# Patient Record
Sex: Male | Born: 1990 | Race: Black or African American | Hispanic: No | Marital: Single | Smoking: Never smoker
Health system: Southern US, Community
[De-identification: ages and names within clinical notes are randomized; demographics above are authoritative.]

## PROBLEM LIST (undated history)

## (undated) DIAGNOSIS — T7840XA Allergy, unspecified, initial encounter: Secondary | ICD-10-CM

## (undated) HISTORY — DX: Allergy, unspecified, initial encounter: T78.40XA

## (undated) HISTORY — PX: HERNIA REPAIR: SHX51

---
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
## 2010-10-24 ENCOUNTER — Encounter: Payer: Self-pay | Admitting: *Deleted

## 2013-01-27 ENCOUNTER — Ambulatory Visit (INDEPENDENT_AMBULATORY_CARE_PROVIDER_SITE_OTHER): Payer: 59 | Admitting: Emergency Medicine

## 2013-01-27 VITALS — BP 120/70 | HR 81 | Temp 98.3°F | Resp 16 | Ht 69.0 in | Wt 141.4 lb

## 2013-01-27 DIAGNOSIS — Z2089 Contact with and (suspected) exposure to other communicable diseases: Secondary | ICD-10-CM

## 2013-01-27 DIAGNOSIS — Z0000 Encounter for general adult medical examination without abnormal findings: Secondary | ICD-10-CM

## 2013-01-27 DIAGNOSIS — N489 Disorder of penis, unspecified: Secondary | ICD-10-CM

## 2013-01-27 DIAGNOSIS — N4889 Other specified disorders of penis: Secondary | ICD-10-CM

## 2013-01-27 DIAGNOSIS — Z202 Contact with and (suspected) exposure to infections with a predominantly sexual mode of transmission: Secondary | ICD-10-CM

## 2013-01-27 LAB — POCT CBC
Granulocyte percent: 55.1 %G (ref 37–80)
HCT, POC: 48.7 % (ref 43.5–53.7)
Hemoglobin: 15.9 g/dL (ref 14.1–18.1)
Lymph, poc: 2.2 (ref 0.6–3.4)
MCH, POC: 27.3 pg (ref 27–31.2)
MCV: 83.7 fL (ref 80–97)
MID (cbc): 0.5 (ref 0–0.9)
POC Granulocyte: 3.3 (ref 2–6.9)
POC LYMPH PERCENT: 36.6 %L (ref 10–50)
POC MID %: 8.3 %M (ref 0–12)
Platelet Count, POC: 208 10*3/uL (ref 142–424)
RBC: 5.82 M/uL (ref 4.69–6.13)
RDW, POC: 13.4 %
WBC: 6 10*3/uL (ref 4.6–10.2)

## 2013-01-27 LAB — RPR

## 2013-01-27 LAB — HIV ANTIBODY (ROUTINE TESTING W REFLEX): HIV: NONREACTIVE

## 2013-01-27 NOTE — Progress Notes (Signed)
  Subjective:    Patient ID: Aaron Garner, male    DOB: Sep 17, 1991, 22 y.o.   MRN: [id number]  HPI    Review of Systems  Constitutional: Negative.   HENT: Negative.   Eyes: Negative.   Respiratory: Negative.   Cardiovascular: Negative.   Gastrointestinal: Negative.   Endocrine: Negative.   Genitourinary: Positive for testicular pain.  Musculoskeletal: Negative.   Skin: Negative.   Allergic/Immunologic: Negative.   Neurological: Negative.   Hematological: Negative.   Psychiatric/Behavioral: Negative.        Objective:   Physical Exam HEENT exam is unremarkable. Neck is supple. Chest is clear to auscultation and percussion. Heart regular rate no murmurs or gallops the the abdomen is soft liver and spleen are not enlarged. Genitourinary exam reveals testicles of normal size no hernias are felt there is a small 2 mm whitish area at the base of the glans which is not ulcerated. There is also a small half centimeter cystic area in the scrotal wall on the left  Results for orders placed in visit on 01/27/13  POCT CBC      Result Value Range   WBC 6.0  4.6 - 10.2 K/uL   Lymph, poc 2.2  0.6 - 3.4   POC LYMPH PERCENT 36.6  10 - 50 %L   MID (cbc) 0.5  0 - 0.9   POC MID % 8.3  0 - 12 %M   POC Granulocyte 3.3  2 - 6.9   Granulocyte percent 55.1  37 - 80 %G   RBC 5.82  4.69 - 6.13 M/uL   Hemoglobin 15.9  14.1 - 18.1 g/dL   HCT, POC 40.9  81.1 - 53.7 %   MCV 83.7  80 - 97 fL   MCH, POC 27.3  27 - 31.2 pg   MCHC 32.6  31.8 - 35.4 g/dL   RDW, POC 91.4     Platelet Count, POC 208  142 - 424 K/uL   MPV 8.8  0 - 99.8 fL        Assessment & Plan:  Patient is concerned about STDs. We'll go ahead and do STD exposure profile.

## 2013-01-28 LAB — GC/CHLAMYDIA PROBE AMP
CT Probe RNA: NEGATIVE
GC Probe RNA: NEGATIVE

## 2013-01-29 LAB — HSV(HERPES SIMPLEX VRS) I + II AB-IGG
HSV 1 Glycoprotein G Ab, IgG: 0.18 IV
HSV 2 Glycoprotein G Ab, IgG: 0.17 IV

## 2013-07-01 ENCOUNTER — Ambulatory Visit (INDEPENDENT_AMBULATORY_CARE_PROVIDER_SITE_OTHER): Payer: 59 | Admitting: Emergency Medicine

## 2013-07-01 VITALS — BP 130/80 | HR 82 | Temp 99.2°F | Resp 20 | Ht 69.0 in | Wt 158.0 lb

## 2013-07-01 DIAGNOSIS — K4090 Unilateral inguinal hernia, without obstruction or gangrene, not specified as recurrent: Secondary | ICD-10-CM

## 2013-07-01 MED ORDER — HYDROCODONE-ACETAMINOPHEN 5-325 MG PO TABS: 1.0000 | ORAL_TABLET | ORAL | Status: DC | PRN

## 2013-07-01 NOTE — Patient Instructions (Addendum)
Hernia A hernia occurs when an internal organ pushes out through a weak spot in the abdominal wall. Hernias most commonly occur in the groin and around the navel. Hernias often can be pushed back into place (reduced). Most hernias tend to get worse over time. Some abdominal hernias can get stuck in the opening (irreducible or incarcerated hernia) and cannot be reduced. An irreducible abdominal hernia which is tightly squeezed into the opening is at risk for impaired blood supply (strangulated hernia). A strangulated hernia is a medical emergency. Because of the risk for an irreducible or strangulated hernia, surgery may be recommended to repair a hernia. CAUSES   Heavy lifting.  Prolonged coughing.  Straining to have a bowel movement.  A cut (incision) made during an abdominal surgery. HOME CARE INSTRUCTIONS   Bed rest is not required. You may continue your normal activities.  Avoid lifting more than 10 pounds (4.5 kg) or straining.  Cough gently. If you are a smoker it is best to stop. Even the best hernia repair can break down with the continual strain of coughing. Even if you do not have your hernia repaired, a cough will continue to aggravate the problem.  Do not wear anything tight over your hernia. Do not try to keep it in with an outside bandage or truss. These can damage abdominal contents if they are trapped within the hernia sac.  Eat a normal diet.  Avoid constipation. Straining over long periods of time will increase hernia size and encourage breakdown of repairs. If you cannot do this with diet alone, stool softeners may be used. SEEK IMMEDIATE MEDICAL CARE IF:   You have a fever.  You develop increasing abdominal pain.  You feel nauseous or vomit.  Your hernia is stuck outside the abdomen, looks discolored, feels hard, or is tender.  You have any changes in your bowel habits or in the hernia that are unusual for you.  You have increased pain or swelling around the  hernia.  You cannot push the hernia back in place by applying gentle pressure while lying down. MAKE SURE YOU:   Understand these instructions.  Will watch your condition.  Will get help right away if you are not doing well or get worse. Document Released: 09/20/2005 Document Revised: 12/13/2011 Document Reviewed: 05/09/2008 ExitCare Patient Information 2014 [employer].  

## 2013-07-01 NOTE — Progress Notes (Addendum)
[facility] [address], [place] [address] [phone]  Date:  07/01/2013   Name:  Aaron Garner   DOB:  May 27, 1991   MRN:  [id number]  PCP:  No PCP Per Patient    Chief Complaint: Hernia   History of Present Illness:  Aaron Garner is a 22 y.o. 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  No history of trauma or overuse.  No heavy lifting.  Has recurrent pain in right groin for the past four months. .  No improvement with over the counter medications or other home remedies. Denies other complaint or health concern today.   No dysuria, urgency or frequency  There are no active problems to display for this patient.   Past Medical History  Diagnosis Date  . Allergy     Past Surgical History  Procedure Laterality Date  . Hernia repair      History  Substance Use Topics  . Smoking status: Never Smoker   . Smokeless tobacco: Not on file  . Alcohol Use: No    Family History  Problem Relation Age of Onset  . Hypertension Mother     No Known Allergies  Medication list has been reviewed and updated.  Current Outpatient Prescriptions on File Prior to Visit  Medication Sig Dispense Refill  . loratadine (CLARITIN) 10 MG tablet Take 10 mg by mouth daily.       No current facility-administered medications on file prior to visit.    Review of Systems:  As per HPI, otherwise negative.    Physical Examination: Filed Vitals:   07/01/13 1140  BP: 130/80  Pulse: 82  Temp: 99.2 F (37.3 C)  Resp: 20   Filed Vitals:   07/01/13 1140  Height: 5\' 9"  (1.753 m)  Weight: 158 lb (71.668 kg)   Body mass index is 23.32 kg/(m^2). Ideal Body Weight: Weight in (lb) to have BMI = 25: 168.9   GEN: WDWN, NAD, Non-toxic, Alert & Oriented x 3 HEENT: Atraumatic, Normocephalic.  Ears and Nose: No external  deformity. EXTR: No clubbing/cyanosis/edema NEURO: Normal gait.  PSYCH: Normally interactive. Conversant. Not depressed or anxious appearing.  Calm demeanor.  Genitalia:  Normal male circumcised.  Testicle and cord normal Groin:  Small hernia right groin  Assessment and Plan: Right inguinal hernia recurrent   Signed,  Phillips Odor, MD

## 2013-07-01 NOTE — Addendum Note (Signed)
Addended by: Carmelina Dane on: 07/01/2013 01:09 PM   Modules accepted: Orders

## 2013-07-06 ENCOUNTER — Ambulatory Visit (INDEPENDENT_AMBULATORY_CARE_PROVIDER_SITE_OTHER): Payer: 59 | Admitting: General Surgery

## 2013-07-06 ENCOUNTER — Encounter (INDEPENDENT_AMBULATORY_CARE_PROVIDER_SITE_OTHER): Payer: Self-pay | Admitting: General Surgery

## 2013-07-06 VITALS — BP 124/76 | HR 82 | Resp 18 | Ht 69.0 in | Wt 156.0 lb

## 2013-07-06 DIAGNOSIS — R1031 Right lower quadrant pain: Secondary | ICD-10-CM

## 2013-07-06 DIAGNOSIS — N5082 Scrotal pain: Secondary | ICD-10-CM | POA: Insufficient documentation

## 2013-07-06 DIAGNOSIS — N509 Disorder of male genital organs, unspecified: Secondary | ICD-10-CM

## 2013-07-06 NOTE — Progress Notes (Signed)
Patient ID: Aaron Garner, male   DOB: [birth year], 22 y.o.   MRN: [id number]  No chief complaint on file.   HPI Aaron Garner is a 22 y.o. male.  The patient is a 22 year old male is referred by Dr. Thornton Papas for an evaluation of a recurrent right inguinal hernia. The patient has a history of a right testicular Torsion status post orchiopexy and 2009.  The patient states since that time She's had some right scrotal discomfort as well as swelling.  The patient was seen in urgent care center and was felt to have a right inguinal hernia. He has had a history of herniorrhaphy as a child. He states that Wearing a truss does provide relief of his discomfort. HPI  Past Medical History  Diagnosis Date  . Allergy     Past Surgical History  Procedure Laterality Date  . Hernia repair      Family History  Problem Relation Age of Onset  . Hypertension Mother     Social History History  Substance Use Topics  . Smoking status: Never Smoker   . Smokeless tobacco: Not on file  . Alcohol Use: No    No Known Allergies  Current Outpatient Prescriptions  Medication Sig Dispense Refill  . HYDROcodone-acetaminophen (NORCO) 5-325 MG per tablet Take 1-2 tablets by mouth every 4 (four) hours as needed for pain.  30 tablet  0  . loratadine (CLARITIN) 10 MG tablet Take 10 mg by mouth daily.       No current facility-administered medications for this visit.    Review of Systems Review of Systems  Constitutional: Negative.   HENT: Negative.   Respiratory: Negative.   Cardiovascular: Negative.   Gastrointestinal: Negative.   Neurological: Negative.   All other systems reviewed and are negative.    There were no vitals taken for this visit.  Physical Exam Physical Exam  Constitutional: He is oriented to person, place, and time. He appears well-developed and well-nourished.  HENT:  Head: Normocephalic and atraumatic.  Eyes: Conjunctivae and EOM are normal. Pupils are equal,  round, and reactive to light.  Neck: Normal range of motion. Neck supple.  Cardiovascular: Normal rate, regular rhythm and normal heart sounds.   Abdominal: Soft. Bowel sounds are normal. Hernia confirmed negative in the right inguinal area and confirmed negative in the left inguinal area.  Genitourinary: Testes normal and penis normal.  Musculoskeletal: Normal range of motion.  Neurological: He is alert and oriented to person, place, and time.    Data Reviewed None  Assessment    22 year old male with questionable recurrent right inguinal hernia versus possible scar tissue and/or hydrocele of his right testicle.     Plan    1. I would recommend at this point to proceed with a CT scan with IV contrast only.  I will call the patient back for the results. Should CT scan show a hernia we will proceed with hernia repair as this could be a potential cause of his right scrotal discomfort. 2. We will refer the patient to urology to evaluate For any possible secondary complications from his orchiopexy that because of his discomfort and or pain.       Marigene Ehlers., Mete Purdum 07/06/2013, 3:16 PM

## 2013-07-06 NOTE — Addendum Note (Signed)
Addended by: June Leap on: 07/06/2013 03:40 PM   Modules accepted: Orders

## 2013-07-13 ENCOUNTER — Other Ambulatory Visit: Payer: Self-pay

## 2013-07-13 ENCOUNTER — Ambulatory Visit
Admission: RE | Admit: 2013-07-13 | Discharge: 2013-07-13 | Disposition: A | Discharge status: Home / Self Care | Payer: 59 | Source: Ambulatory Visit | Attending: General Surgery | Admitting: General Surgery

## 2013-07-13 DIAGNOSIS — R1031 Right lower quadrant pain: Secondary | ICD-10-CM

## 2013-07-13 MED ORDER — IOHEXOL 300 MG/ML  SOLN
100.0000 mL | Freq: Once | INTRAMUSCULAR | Status: AC | PRN
  Administered 2013-07-13: 100 mL via INTRAVENOUS

## 2013-07-16 ENCOUNTER — Telehealth (INDEPENDENT_AMBULATORY_CARE_PROVIDER_SITE_OTHER): Payer: Self-pay | Admitting: General Surgery

## 2013-07-16 NOTE — Telephone Encounter (Signed)
Faxed copy of CT to Dr.Hawkes office and confirm was received per AR req

## 2013-07-16 NOTE — Telephone Encounter (Signed)
Message copied by June Leap on Mon Jul 16, 2013 11:16 AM ------      Message from: Axel Filler      Created: Mon Jul 16, 2013  9:56 AM       Can you forward this Path note to Dr. Nickola Major please?            Thanks      AR      ----- Message -----         From: Rad Results In Interface         Sent: 07/13/2013   4:16 PM           To: Axel Filler, MD                   ------

## 2013-07-21 ENCOUNTER — Encounter (INDEPENDENT_AMBULATORY_CARE_PROVIDER_SITE_OTHER): Payer: Self-pay | Admitting: General Surgery

## 2013-08-09 ENCOUNTER — Other Ambulatory Visit: Payer: Self-pay

## 2014-04-19 ENCOUNTER — Ambulatory Visit (INDEPENDENT_AMBULATORY_CARE_PROVIDER_SITE_OTHER): Payer: 59 | Admitting: Family Medicine

## 2014-04-19 VITALS — BP 112/60 | HR 90 | Temp 98.6°F | Resp 18

## 2014-04-19 DIAGNOSIS — Z23 Encounter for immunization: Secondary | ICD-10-CM

## 2014-04-19 NOTE — Progress Notes (Signed)
   Subjective:    Patient ID: Aaron Garner, male    DOB: 1991-03-28, 23 y.o.   MRN: [id number]  HPI Patient presents today for Tdap vaccination. 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 He can not recall his last tetanus vaccine.   Review of Systems     Objective:   Physical Exam        Assessment & Plan:  1. Need for prophylactic vaccination with combined diphtheria-tetanus-pertussis (DTP) vaccine - Tdap vaccine greater than or equal to 7yo IM   Emi Belfasteborah B. Gessner, FNP-BC  [facility]  04/19/2014 3:23 PM

## 2015-01-14 ENCOUNTER — Ambulatory Visit (INDEPENDENT_AMBULATORY_CARE_PROVIDER_SITE_OTHER): Payer: 59 | Admitting: Emergency Medicine

## 2015-01-14 VITALS — BP 114/72 | HR 67 | Temp 98.3°F | Resp 16 | Ht 69.75 in | Wt 137.0 lb

## 2015-01-14 DIAGNOSIS — Z202 Contact with and (suspected) exposure to infections with a predominantly sexual mode of transmission: Secondary | ICD-10-CM | POA: Diagnosis not present

## 2015-01-14 LAB — HIV ANTIBODY (ROUTINE TESTING W REFLEX): HIV 1&2 Ab, 4th Generation: NONREACTIVE

## 2015-01-14 NOTE — Patient Instructions (Signed)

## 2015-01-14 NOTE — Progress Notes (Signed)
[facility] [address], [place] [address] [phone]  Date:  01/14/2015   Name:  Aaron MattesMichael D Freel   DOB:  1991/01/18   MRN:  [id number]  PCP:  Carmelina DaneAnderson, Mckinleigh Schuchart S, MD    Chief Complaint: STD Testing   History of Present Illness:  Aaron MattesMichael D Garner is a 24 y.o. very pleasant male patient who presents with the following:  STD test.  No history of prior infection Not symptomatic, just concerned Denies other complaint or health concern today.   There are no active problems to display for this patient.   Past Medical History  Diagnosis Date  . Allergy     Past Surgical History  Procedure Laterality Date  . Hernia repair      History  Substance Use Topics  . Smoking status: Never Smoker   . Smokeless tobacco: Not on file  . Alcohol Use: No    Family History  Problem Relation Age of Onset  . Hypertension Mother     No Known Allergies  Medication list has been reviewed and updated.  Current Outpatient Prescriptions on File Prior to Visit  Medication Sig Dispense Refill  . loratadine (CLARITIN) 10 MG tablet Take 10 mg by mouth daily.     No current facility-administered medications on file prior to visit.    Review of Systems:  As per HPI, otherwise negative.    Physical Examination: Filed Vitals:   01/14/15 0859  BP: 114/72  Pulse: 67  Temp: 98.3 F (36.8 C)  Resp: 16   Filed Vitals:   01/14/15 0859  Height: 5' 9.75" (1.772 m)  Weight: 137 lb (62.143 kg)   Body mass index is 19.79 kg/(m^2). Ideal Body Weight: Weight in (lb) to have BMI = 25: 172.6  GEN: WDWN, NAD, Non-toxic, A & O x 3 HEENT: Atraumatic, Normocephalic. Neck supple. No masses, No LAD. Ears and Nose: No external deformity. CV: RRR, No M/G/R. No JVD. No thrill. No extra heart sounds. PULM: CTA B, no wheezes, crackles, rhonchi. No retractions. No resp. distress. No accessory muscle use. ABD: S, NT, ND, +BS. No rebound. No HSM. EXTR: No c/c/e NEURO  Normal gait.  PSYCH: Normally interactive. Conversant. Not depressed or anxious appearing.  Calm demeanor.    Assessment and Plan: STD testing. Labs pending Signed,  Phillips OdorJeffery Dearius Hoffmann, MD

## 2015-01-15 LAB — RPR

## 2015-01-15 LAB — GC/CHLAMYDIA PROBE AMP
CT PROBE, AMP APTIMA: NEGATIVE
GC Probe RNA: NEGATIVE

## 2015-04-17 ENCOUNTER — Encounter: Payer: Self-pay | Admitting: Dietician

## 2015-04-17 ENCOUNTER — Encounter: Payer: BC Managed Care – PPO | Attending: Emergency Medicine | Admitting: Dietician

## 2015-04-17 VITALS — Ht 69.0 in | Wt 131.4 lb

## 2015-04-17 DIAGNOSIS — E739 Lactose intolerance, unspecified: Secondary | ICD-10-CM | POA: Diagnosis not present

## 2015-04-17 DIAGNOSIS — Z681 Body mass index (BMI) 19 or less, adult: Secondary | ICD-10-CM | POA: Insufficient documentation

## 2015-04-17 DIAGNOSIS — Z713 Dietary counseling and surveillance: Secondary | ICD-10-CM | POA: Insufficient documentation

## 2015-04-17 NOTE — Progress Notes (Signed)
  Medical Nutrition Therapy:  Appt start time: 0430 end time:  525   Assessment:  Primary concerns today: Aaron Garner states that he is here to discuss a better diet and he wants more "control" of his GI symptoms. He has been diagnosed with lactose intolerance. He thinks he may also be gluten intolerant. He can tolerate yogurt, but not cheese or milk. He has taken Lactaid pills but says they don't help much. Aaron Garner would like to eat a healthier diet overall. 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 He lives with his parents and his mom cooks. He plans to move out and do most of his own cooking. Considering becoming vegan or vegetarian. He experiences dumping symptoms with candy. With wheat he gets "rumbles in his stomach" and gas pain.   Preferred Learning Style:   No preference indicated   Learning Readiness:   Ready   MEDICATIONS: none   DIETARY INTAKE:  Foods not tolerated: -Milk and cheese -Juice -Wheat products (bread) -Sweets -Eggs -Nuts    24-hr recall:  B ( AM): usually skips, granola bar  Snk ( AM): fruit (orange or apple)  L ( PM): pork chops, hamburgers, chicken (fast food) Snk ( PM):  D ( PM): meat, vegetables Snk ( PM): fruit or candy  Beverages: water, juice, green tea, sweet tea  Usual physical activity: basketball and workout plan  Estimated energy needs: 2500-2700 calories  Progress Towards Goal(s):  In progress.   Nutritional Diagnosis:  [place]-1.4 Altered GI function As related to lactose intolerance and possible gluten intolerance.  As evidenced by patient reports abdominal cramping and pain and changes in bowel habits with many foods.    Intervention:  Nutrition counseling provided. Goals: -Keep a food diary to try to identify foods that you do not tolerate  -Include symptoms in your diary -Add a multivitamin   Work on eating a variety of foods and include a protein food with each meal: Fruit Vegetables Lactaid or almond  milk Rice Potatoes  Beans Seeds (sunflower, pumpkin)  Quinoa and other grains Gluten free, corn based tortillas Edamame  Try Amy's Gluten free burritos and other gluten free meals Try having a protein shake for breakfast: protein powder, almond milk, fruit, spinach  Sample provided and patient instructed on proper use: Unjury protein powder (vanilla - qty 4) Lot#: [id number] Exp: 04/2016  Teaching Method Utilized:  Visual Auditory  Handouts given during visit include:  Vegetarian proteins  Gluten free eating  Meal planning card  MyPlate  Barriers to learning/adherence to lifestyle change: GI symptoms  Demonstrated degree of understanding via:  Teach Back   Monitoring/Evaluation:  Dietary intake and GI symptoms prn.

## 2015-04-17 NOTE — Patient Instructions (Addendum)
-  Keep a food diary to try to identify foods that you do not tolerate  -Include symptoms in your diary  -Add a multivitamin   Work on eating a variety of foods and include a protein food with each meal: Fruit Vegetables Lactaid or almond milk Rice Potatoes  Beans Seeds (sunflower, pumpkin)  Quinoa and other grains Gluten free, corn based tortillas Edamame  Try Amy's Gluten free burritos and other gluten free meals  Try having a protein shake for breakfast: protein powder, almond milk, fruit, spinach

## 2016-07-13 ENCOUNTER — Ambulatory Visit (INDEPENDENT_AMBULATORY_CARE_PROVIDER_SITE_OTHER): Payer: BC Managed Care – PPO | Admitting: Family Medicine

## 2016-07-13 VITALS — BP 130/76 | HR 82 | Temp 97.7°F | Resp 16 | Ht 69.0 in | Wt 131.0 lb

## 2016-07-13 DIAGNOSIS — Z113 Encounter for screening for infections with a predominantly sexual mode of transmission: Secondary | ICD-10-CM

## 2016-07-13 DIAGNOSIS — N489 Disorder of penis, unspecified: Secondary | ICD-10-CM | POA: Diagnosis not present

## 2016-07-13 DIAGNOSIS — L708 Other acne: Secondary | ICD-10-CM

## 2016-07-13 DIAGNOSIS — Z114 Encounter for screening for human immunodeficiency virus [HIV]: Secondary | ICD-10-CM

## 2016-07-13 MED ORDER — DOXYCYCLINE HYCLATE 100 MG PO TABS: 100.0000 mg | ORAL_TABLET | Freq: Two times a day (BID) | ORAL | 0 refills | Status: DC

## 2016-07-13 NOTE — Assessment & Plan Note (Signed)
Will treat as acne, as they appeared to be white comedones.  Will refer to dermatology in case this does not improve and with potential for biopsy.  Recommended switching condoms as well.

## 2016-07-13 NOTE — Progress Notes (Signed)
Patient ID: Aaron Garner, male   DOB: [birth year], 25 y.o.   MRN: [id number]  Pt independently assessed by myself , reviewed documentation and agree w/ assessment and plan.  Aaron SorensonEva Shaw, M.D. [facility]  [facility] [address] [place], [place] [address] [phone] phone [phone] fax  07/13/16 9:51 PM

## 2016-07-13 NOTE — Patient Instructions (Signed)
Placed referral to Dermatology.  Take antibiotic as prescribed and cancel referral if resolves.  Try switching condoms.

## 2016-07-13 NOTE — Progress Notes (Signed)
   Subjective:    Patient ID: Aaron Garner, male    DOB: [birth year], 25 y.o.   MRN: [id number]  HPI Presents with lesions on penile shaft that began 1 month ago.  He is sexually active with 1 partner, but has been sexually active with 2 partners in the past 6 months.  He has been screened previously and it has been negative.  Denies dysuria, fevers, chills, penile discharge.  Lesions are not pruritic or painful.  Has tried switching lubricants, has used the same type of condoms.  He has tried an acne medication on it.      Review of Systems  Constitutional: Negative for chills, fatigue and fever.  Genitourinary: Negative for discharge, dysuria, penile pain, penile swelling, scrotal swelling, testicular pain and urgency.  Musculoskeletal: Negative for joint swelling and myalgias.  Skin: Positive for rash. Negative for color change, pallor and wound.  Neurological: Negative for dizziness and weakness.  All other systems reviewed and are negative.      Objective:   Physical Exam  Constitutional: He is oriented to person, place, and time. He appears well-developed and well-nourished. No distress.  HENT:  Head: Normocephalic and atraumatic.  Eyes: Conjunctivae are normal. No scleral icterus.  Neck: Normal range of motion. Neck supple.  Pulmonary/Chest: Effort normal and breath sounds normal.  Genitourinary: No penile tenderness.  Genitourinary Comments: Small white macules present throughout penile shaft, none on glands of scrotum.  No central umbilication.  Musculoskeletal: He exhibits no edema or deformity.  Neurological: He is alert and oriented to person, place, and time.  Skin: Skin is warm. No rash noted. He is not diaphoretic. No erythema. No pallor.  Psychiatric: He has a normal mood and affect. His behavior is normal. Judgment and thought content normal.  Nursing note and vitals reviewed.   BP 130/76 (BP Location: Right Arm, Patient Position: Sitting, Cuff Size: Normal)    Pulse 82   Temp 97.7 F (36.5 C) (Oral)   Resp 16   Ht 5\' 9"  (1.753 m)   Wt 131 lb (59.4 kg)   SpO2 100%   BMI 19.35 kg/m        Assessment & Plan:  Penile lesion Will treat as acne, as they appeared to be white comedones.  Will refer to dermatology in case this does not improve and with potential for biopsy.  Recommended switching condoms as well.

## 2016-07-14 LAB — GC/CHLAMYDIA PROBE AMP
CT PROBE, AMP APTIMA: NOT DETECTED
GC PROBE AMP APTIMA: NOT DETECTED

## 2016-07-14 LAB — HIV ANTIBODY (ROUTINE TESTING W REFLEX): HIV 1&2 Ab, 4th Generation: NONREACTIVE

## 2016-07-15 LAB — RPR

## 2016-07-20 ENCOUNTER — Encounter: Payer: Self-pay | Admitting: Student

## 2016-07-20 NOTE — Progress Notes (Signed)
Resulted Orders  GC/Chlamydia Probe Amp  Result Value Ref Range   CT Probe RNA NOT DETECTED      Comment:                        **Normal Reference Range: NOT DETECTED**   This test was performed using the APTIMA COMBO2 Assay ([employer]   The analytical performance characteristics of this assay, when used to test SurePath specimens have been determined by [facility]      GC Probe RNA NOT DETECTED      Comment:                        **Normal Reference Range: NOT DETECTED**   This test was performed using the APTIMA COMBO2 Assay ([employer]   The analytical performance characteristics of this assay, when used to test SurePath specimens have been determined by [facility]      Narrative   Performed at:  [facility]                [address], [address]                [place], [place] [address]  HIV antibody  Result Value Ref Range   HIV 1&2 Ab, 4th Generation NONREACTIVE NONREACTIVE     Comment:       HIV-1 antigen and HIV-1/HIV-2 antibodies were not detected.  There is no laboratory evidence of HIV infection.   HIV-1/2 Antibody Diff        Not indicated. HIV-1 RNA, Qual TMA          Not indicated.     PLEASE NOTE: This information has been disclosed to you from records whose confidentiality may be protected by state law. If your state requires such protection, then the state law prohibits you from making any further disclosure of the information without the specific written consent of the person to whom it pertains, or as otherwise permitted by law. A general authorization for the release of medical or other information is NOT sufficient for this purpose.   The performance of this assay has not been clinically validated in patients less than 93 years old.   For additional information please refer to http://education.[facility].com/faq/FAQ106.  (This link is being provided for informational/educational purposes only.)      Narrative   Performed at:  [facility]                [address], [address]                [place], [place] [address]  RPR  Result Value Ref Range   RPR Ser Ql NON REAC NON REAC   Narrative   Performed at:  [facility]                [address], [address]                [place], [place] [address]

## 2017-12-21 ENCOUNTER — Ambulatory Visit: Payer: BC Managed Care – PPO | Admitting: Physician Assistant

## 2017-12-21 ENCOUNTER — Encounter: Payer: Self-pay | Admitting: Physician Assistant

## 2017-12-21 ENCOUNTER — Other Ambulatory Visit: Payer: Self-pay

## 2017-12-21 VITALS — BP 137/74 | HR 81 | Temp 98.6°F | Resp 16 | Ht 69.0 in | Wt 136.2 lb

## 2017-12-21 DIAGNOSIS — Z202 Contact with and (suspected) exposure to infections with a predominantly sexual mode of transmission: Secondary | ICD-10-CM

## 2017-12-21 DIAGNOSIS — M62838 Other muscle spasm: Secondary | ICD-10-CM

## 2017-12-21 DIAGNOSIS — H00015 Hordeolum externum left lower eyelid: Secondary | ICD-10-CM | POA: Diagnosis not present

## 2017-12-21 DIAGNOSIS — R319 Hematuria, unspecified: Secondary | ICD-10-CM | POA: Diagnosis not present

## 2017-12-21 LAB — POCT URINALYSIS DIP (MANUAL ENTRY)
Bilirubin, UA: NEGATIVE
Glucose, UA: NEGATIVE mg/dL
Ketones, POC UA: NEGATIVE mg/dL
Leukocytes, UA: NEGATIVE
Nitrite, UA: NEGATIVE
Protein Ur, POC: NEGATIVE mg/dL
Spec Grav, UA: 1.02 (ref 1.010–1.025)
Urobilinogen, UA: 1 E.U./dL
pH, UA: 6.5 (ref 5.0–8.0)

## 2017-12-21 LAB — POC MICROSCOPIC URINALYSIS (UMFC): Mucus: ABSENT

## 2017-12-21 MED ORDER — CYCLOBENZAPRINE HCL 10 MG PO TABS: 10.0000 mg | ORAL_TABLET | Freq: Three times a day (TID) | ORAL | 0 refills | Status: DC | PRN

## 2017-12-21 MED ORDER — MUPIROCIN 2 % EX OINT: 1.0000 "application " | TOPICAL_OINTMENT | Freq: Three times a day (TID) | CUTANEOUS | 1 refills | Status: DC

## 2017-12-21 NOTE — Progress Notes (Signed)
   Aaron MattesMichael D Loyal  MRN: [id number] DOB: 07-20-1991  PCP: Ethelda ChickSmith, Kristi M, MD  Subjective:  Pt is a 27 year old male who presents to clinic for several complaints.   Possible exposure to STDs. "It's just something I do every year".   C/o neck and back pain x 1 year. Pain started in right shoulder. Radiates down right side of his back. "feels tight".   Stye of left lower eye lid.   Review of Systems  Musculoskeletal: Positive for back pain and neck pain.  Skin: Positive for wound.    Patient Active Problem List   Diagnosis Date Noted  . Other acne 07/13/2016  . Penile lesion 07/13/2016    Current Outpatient Medications on File Prior to Visit  Medication Sig Dispense Refill  . loratadine (CLARITIN) 10 MG tablet Take 10 mg by mouth daily.     No current facility-administered medications on file prior to visit.     No Known Allergies   Objective:  BP 137/74   Pulse 81   Temp 98.6 F (37 C) (Oral)   Resp 16   Ht 5\' 9"  (1.753 m)   Wt 136 lb 3.2 oz (61.8 kg)   SpO2 100%   BMI 20.11 kg/m   Physical Exam  Constitutional: He is oriented to person, place, and time and well-developed, well-nourished, and in no distress. No distress.  Cardiovascular: Normal rate, regular rhythm and normal heart sounds.  Musculoskeletal:       Cervical back: He exhibits tenderness and spasm. He exhibits normal range of motion and no bony tenderness.       Back:  Neurological: He is alert and oriented to person, place, and time. GCS score is 15.  Skin: Skin is warm and dry.  Psychiatric: Mood, memory, affect and judgment normal.  Vitals reviewed.   A trigger point injection was performed at the site of maximal tenderness using 1% plain Lidocaine. This was well tolerated, and followed by moderate relief of pain.  Assessment and Plan :  1. Possible exposure to STD - Chlamydia/Gonococcus/Trichomonas, NAA - HIV antibody - RPR - Pt denies symptoms. Requests routine screening. Labs are  pending. Will contact with results.  2. Hematuria, unspecified type - POCT urinalysis dipstick - POCT Microscopic Urinalysis ([facility]) - Urine Culture 3. Hordeolum externum of left lower eyelid - mupirocin ointment (BACTROBAN) 2 %; Apply 1 application topically 3 (three) times daily.  Dispense: 22 g; Refill: 1 - Con't warm compress. Apply bactroban to external area only. OK to refer to ophthalmology for I&D if no improvement.  4. Muscle spasm - cyclobenzaprine (FLEXERIL) 10 MG tablet; Take 1 tablet (10 mg total) by mouth 3 (three) times daily as needed for muscle spasms.  Dispense: 12 tablet; Refill: 0 - Pt reports improvement after trigger point injection. Advised heat, massage, hydration.  RTC in 2-3 weeks if pain returns.   Marco CollieWhitney Trenton Verne, PA-C  Primary Care at [facility] 12/21/2017 10:22 AM

## 2017-12-21 NOTE — Patient Instructions (Addendum)
You can take flexeril to help your shoulder.  Massage your shoulder several times a day with tennis ball.  Apply moist heat 2-3 times daily.  Stretch your neck muscles.   Apply heat to your eye - warm compresses, which are placed on the face for about 15 minutes four times per day. Massage and gentle wiping of the affected eyelid after the warm compress can also aid in drainage. Apply the mupirocin ointment to your eye 2-3 times daily  - do not put this in your eye!  If, despite management with warm compresses, the lesion does not reduce in size within one to two weeks, the patient should be referred to an ophthalmologist for incision and drainage.   Thank you for coming in today. I hope you feel we met your needs.  Feel free to call PCP if you have any questions or further requests.  Please consider signing up for MyChart if you do not already have it, as this is a great way to communicate with me.  Best,  Whitney McVey, PA-C  IF you received an x-ray today, you will receive an invoice from [facility]. Please contact [facility] at [phone] with questions or concerns regarding your invoice.   IF you received labwork today, you will receive an invoice from [facility]. Please contact [facility] at [phone] with questions or concerns regarding your invoice.   Our billing staff will not be able to assist you with questions regarding bills from these companies.  You will be contacted with the lab results as soon as they are available. The fastest way to get your results is to activate your My Chart account. Instructions are located on the last page of this paperwork. If you have not heard from [place] regarding the results in 2 weeks, please contact this office.

## 2017-12-22 LAB — URINE CULTURE: Organism ID, Bacteria: NO GROWTH

## 2017-12-22 LAB — RPR: RPR Ser Ql: NONREACTIVE

## 2017-12-22 LAB — HIV ANTIBODY (ROUTINE TESTING W REFLEX): HIV Screen 4th Generation wRfx: NONREACTIVE

## 2017-12-27 ENCOUNTER — Telehealth: Payer: Self-pay | Admitting: Family Medicine

## 2017-12-27 NOTE — Telephone Encounter (Signed)
Copied from CRM [id number]. Topic: Quick Communication - See Telephone Encounter >> Dec 27, 2017  9:37 AM Arlyss Gandyichardson, Lucetta Baehr N, NT wrote: CRM for notification. See Telephone encounter for: 12/27/17. Pt would like a call with his lab results.

## 2017-12-27 NOTE — Telephone Encounter (Signed)
Provider, please review and release results.  

## 2017-12-28 LAB — CHLAMYDIA/GONOCOCCUS/TRICHOMONAS, NAA
Chlamydia by NAA: NEGATIVE
Gonococcus by NAA: NEGATIVE
Trich vag by NAA: NEGATIVE

## 2018-01-06 NOTE — Telephone Encounter (Signed)
Please call pt and let him know he is negative for gonorrhea, chlamydia, trichomonas, HIV and syphilis. Thank you!

## 2018-01-09 NOTE — Telephone Encounter (Signed)
Pt advised.

## 2018-02-17 ENCOUNTER — Encounter: Payer: Self-pay | Admitting: Family Medicine

## 2018-02-22 ENCOUNTER — Encounter: Payer: Self-pay | Admitting: Family Medicine

## 2020-04-09 ENCOUNTER — Encounter: Payer: Self-pay | Admitting: Registered Nurse

## 2020-04-09 ENCOUNTER — Other Ambulatory Visit: Payer: Self-pay

## 2020-04-09 ENCOUNTER — Ambulatory Visit: Payer: BC Managed Care – PPO | Admitting: Registered Nurse

## 2020-04-09 ENCOUNTER — Other Ambulatory Visit (HOSPITAL_COMMUNITY)
Admission: RE | Admit: 2020-04-09 | Discharge: 2020-04-09 | Disposition: A | Discharge status: Home / Self Care | Payer: 59 | Source: Ambulatory Visit | Attending: Registered Nurse | Admitting: Registered Nurse

## 2020-04-09 VITALS — BP 150/90 | HR 94 | Temp 97.8°F | Ht 69.0 in | Wt 157.6 lb

## 2020-04-09 DIAGNOSIS — R4582 Worries: Secondary | ICD-10-CM | POA: Diagnosis present

## 2020-04-09 DIAGNOSIS — Z113 Encounter for screening for infections with a predominantly sexual mode of transmission: Secondary | ICD-10-CM

## 2020-04-09 DIAGNOSIS — Z1159 Encounter for screening for other viral diseases: Secondary | ICD-10-CM | POA: Diagnosis not present

## 2020-04-09 NOTE — Patient Instructions (Signed)
° ° ° °  If you have lab work done today you will be contacted with your lab results within the next 2 weeks.  If you have not heard from us then please contact us. The fastest way to get your results is to register for My Chart. ° ° °IF you received an x-ray today, you will receive an invoice from [facility]. Please contact [facility] at [phone] with questions or concerns regarding your invoice.  ° °IF you received labwork today, you will receive an invoice from [facility]. Please contact [facility] at [phone] with questions or concerns regarding your invoice.  ° °Our billing staff will not be able to assist you with questions regarding bills from these companies. ° °You will be contacted with the lab results as soon as they are available. The fastest way to get your results is to activate your My Chart account. Instructions are located on the last page of this paperwork. If you have not heard from us regarding the results in 2 weeks, please contact this office. °  ° ° ° °

## 2020-04-09 NOTE — Progress Notes (Signed)
Established Patient Office Visit  Subjective:  Patient ID: Aaron Garner, male    DOB: 1991-01-17  Age: 29 y.o. MRN: [id number]  CC:  Chief Complaint  Patient presents with  . std testing    Pt is here just for routine testing    HPI Aaron Garner presents for STD screen  In a monogamous relationship x 1 year. No symptoms. Partner does not have symptoms. No previous positive testing.  Interested in full testing for routine peace of mind  Past Medical History:  Diagnosis Date  . Allergy     Past Surgical History:  Procedure Laterality Date  . HERNIA REPAIR      Family History  Problem Relation Age of Onset  . Hypertension Mother     Social History   Socioeconomic History  . Marital status: Single    Spouse name: Not on file  . Number of children: Not on file  . Years of education: Not on file  . Highest education level: Not on file  Occupational History  . Occupation: Electronics engineer  Tobacco Use  . Smoking status: Never Smoker  . Smokeless tobacco: Never Used  Substance and Sexual Activity  . Alcohol use: No  . Drug use: No  . Sexual activity: Yes  Other Topics Concern  . Not on file  Social History Narrative   Single. Education: Electronics engineer. Exercise: Calisthenics 3 days a week for 2 hours.         Social Determinants of Health   Financial Resource Strain:   . Difficulty of Paying Living Expenses:   Food Insecurity:   . Worried About Charity fundraiser in the Last Year:   . Arboriculturist in the Last Year:   Transportation Needs:   . Film/video editor (Medical):   Marland Kitchen Lack of Transportation (Non-Medical):   Physical Activity:   . Days of Exercise per Week:   . Minutes of Exercise per Session:   Stress:   . Feeling of Stress :   Social Connections:   . Frequency of Communication with Friends and Family:   . Frequency of Social Gatherings with Friends and Family:   . Attends Religious Services:   . Active Member of Clubs or  Organizations:   . Attends Archivist Meetings:   Marland Kitchen Marital Status:   Intimate Partner Violence:   . Fear of Current or Ex-Partner:   . Emotionally Abused:   Marland Kitchen Physically Abused:   . Sexually Abused:     Outpatient Medications Prior to Visit  Medication Sig Dispense Refill  . loratadine (CLARITIN) 10 MG tablet Take 10 mg by mouth daily.    . cyclobenzaprine (FLEXERIL) 10 MG tablet Take 1 tablet (10 mg total) by mouth 3 (three) times daily as needed for muscle spasms. 12 tablet 0  . mupirocin ointment (BACTROBAN) 2 % Apply 1 application topically 3 (three) times daily. 22 g 1   No facility-administered medications prior to visit.    No Known Allergies  ROS Review of Systems Negative on 10pt ROS    Objective:    Physical Exam Vitals and nursing note reviewed.  Constitutional:      General: He is not in acute distress.    Appearance: Normal appearance. He is normal weight. He is not ill-appearing, toxic-appearing or diaphoretic.  Cardiovascular:     Rate and Rhythm: Normal rate and regular rhythm.  Neurological:     General: No focal deficit present.  Mental Status: He is alert and oriented to person, place, and time. Mental status is at baseline.  Psychiatric:        Mood and Affect: Mood normal.        Behavior: Behavior normal.        Thought Content: Thought content normal.        Judgment: Judgment normal.     BP (!) 150/90   Pulse 94   Temp 97.8 F (36.6 C) (Temporal)   Ht _0  (1.753 m)   Wt 157 lb 9.6 oz (71.5 kg)   SpO2 99%   BMI 23.27 kg/m  Wt Readings from Last 3 Encounters:  04/09/20 157 lb 9.6 oz (71.5 kg)  12/21/17 136 lb 3.2 oz (61.8 kg)  07/13/16 131 lb (59.4 kg)     Health Maintenance Due  Topic Date Due  . Hepatitis C Screening  Never done  . COVID-19 Vaccine (1) Never done    There are no preventive care reminders to display for this patient.  No results found for: TSH Lab Results  Component Value Date   WBC 6.0  01/27/2013   HGB 15.9 01/27/2013   HCT 48.7 01/27/2013   MCV 83.7 01/27/2013   No results found for: NA, K, CHLORIDE, CO2, GLUCOSE, BUN, CREATININE, BILITOT, ALKPHOS, AST, ALT, PROT, ALBUMIN, CALCIUM, ANIONGAP, EGFR, GFR No results found for: CHOL No results found for: HDL No results found for: LDLCALC No results found for: TRIG No results found for: CHOLHDL No results found for: HGBA1C    Assessment & Plan:   Problem List Items Addressed This Visit    None    Visit Diagnoses    Screening for viral disease    -  Primary   Relevant Orders   HIV antibody (with reflex)   Hepatitis C antibody   Screen for STD (sexually transmitted disease)       Relevant Orders   GC/Chlamydia probe amp ([place])not at [facility]   RPR   HIV antibody (with reflex)   Hepatitis C antibody   Worries       Relevant Orders   GC/Chlamydia probe amp ([place])not at [facility]   RPR   HIV antibody (with reflex)   Hepatitis C antibody      No orders of the defined types were placed in this encounter.   Follow-up: No follow-ups on file.   PLAN  Testing collected  Will follow up as warranted  Patient encouraged to call clinic with any questions, comments, or concerns.  Maximiano Coss, NP

## 2020-04-10 LAB — HEPATITIS C ANTIBODY: Hep C Virus Ab: <0.1 s/co ratio (ref 0.0–0.9)

## 2020-04-10 LAB — HIV ANTIBODY (ROUTINE TESTING W REFLEX): HIV Screen 4th Generation wRfx: NONREACTIVE

## 2020-04-10 LAB — RPR: RPR Ser Ql: NONREACTIVE

## 2020-04-11 LAB — GC/CHLAMYDIA PROBE AMP (~~LOC~~) NOT AT ARMC
Chlamydia: NEGATIVE
Comment: NEGATIVE
Comment: NORMAL
Neisseria Gonorrhea: NEGATIVE

## 2020-11-25 ENCOUNTER — Encounter: Payer: Self-pay | Admitting: Registered Nurse

## 2020-11-25 ENCOUNTER — Other Ambulatory Visit: Payer: Self-pay

## 2020-11-25 ENCOUNTER — Other Ambulatory Visit (HOSPITAL_COMMUNITY)
Admission: RE | Admit: 2020-11-25 | Discharge: 2020-11-25 | Disposition: A | Discharge status: Home / Self Care | Payer: 59 | Source: Ambulatory Visit | Attending: Registered Nurse | Admitting: Registered Nurse

## 2020-11-25 ENCOUNTER — Ambulatory Visit (INDEPENDENT_AMBULATORY_CARE_PROVIDER_SITE_OTHER): Payer: 59 | Admitting: Registered Nurse

## 2020-11-25 VITALS — BP 135/83 | HR 77 | Temp 97.7°F | Resp 18 | Ht 69.0 in | Wt 153.0 lb

## 2020-11-25 DIAGNOSIS — Z113 Encounter for screening for infections with a predominantly sexual mode of transmission: Secondary | ICD-10-CM | POA: Insufficient documentation

## 2020-11-25 DIAGNOSIS — Z1329 Encounter for screening for other suspected endocrine disorder: Secondary | ICD-10-CM

## 2020-11-25 DIAGNOSIS — Z13228 Encounter for screening for other metabolic disorders: Secondary | ICD-10-CM | POA: Diagnosis not present

## 2020-11-25 DIAGNOSIS — Z13220 Encounter for screening for lipoid disorders: Secondary | ICD-10-CM | POA: Diagnosis not present

## 2020-11-25 DIAGNOSIS — Z130 Encounter for screening for diseases of the blood and blood-forming organs and certain disorders involving the immune mechanism: Secondary | ICD-10-CM

## 2020-11-25 NOTE — Patient Instructions (Signed)
° ° ° °  If you have lab work done today you will be contacted with your lab results within the next 2 weeks.  If you have not heard from us then please contact us. The fastest way to get your results is to register for My Chart. ° ° °IF you received an x-ray today, you will receive an invoice from [facility]. Please contact [facility] at [phone] with questions or concerns regarding your invoice.  ° °IF you received labwork today, you will receive an invoice from [facility]. Please contact [facility] at [phone] with questions or concerns regarding your invoice.  ° °Our billing staff will not be able to assist you with questions regarding bills from these companies. ° °You will be contacted with the lab results as soon as they are available. The fastest way to get your results is to activate your My Chart account. Instructions are located on the last page of this paperwork. If you have not heard from us regarding the results in 2 weeks, please contact this office. °  ° ° ° °

## 2020-11-25 NOTE — Progress Notes (Signed)
Established Patient Office Visit  Subjective:  Patient ID: Aaron Garner, male    DOB: 1991-07-30  Age: 30 y.o. MRN: [id number]  CC:  Chief Complaint  Patient presents with  . Labs Only    Patient states he is here for labs and std testing.    HPI Aaron Garner presents for labs and routine STI screening No acute concerns, feeling well overall. Has been some time since routine labs done No known exposure to STI, no sxs Otherwise no complaints  Past Medical History:  Diagnosis Date  . Allergy     Past Surgical History:  Procedure Laterality Date  . HERNIA REPAIR      Family History  Problem Relation Age of Onset  . Hypertension Mother     Social History   Socioeconomic History  . Marital status: Single    Spouse name: Not on file  . Number of children: Not on file  . Years of education: Not on file  . Highest education level: Not on file  Occupational History  . Occupation: Electronics engineer  Tobacco Use  . Smoking status: Never Smoker  . Smokeless tobacco: Never Used  Substance and Sexual Activity  . Alcohol use: No  . Drug use: No  . Sexual activity: Yes  Other Topics Concern  . Not on file  Social History Narrative   Single. Education: Electronics engineer. Exercise: Calisthenics 3 days a week for 2 hours.         Social Determinants of Health   Financial Resource Strain: Not on file  Food Insecurity: Not on file  Transportation Needs: Not on file  Physical Activity: Not on file  Stress: Not on file  Social Connections: Not on file  Intimate Partner Violence: Not on file    Outpatient Medications Prior to Visit  Medication Sig Dispense Refill  . loratadine (CLARITIN) 10 MG tablet Take 10 mg by mouth daily.     No facility-administered medications prior to visit.    No Known Allergies  ROS Review of Systems  Constitutional: Negative.   HENT: Negative.   Eyes: Negative.   Respiratory: Negative.   Cardiovascular: Negative.    Gastrointestinal: Negative.   Genitourinary: Negative.   Musculoskeletal: Negative.   Skin: Negative.   Neurological: Negative.   Psychiatric/Behavioral: Negative.   All other systems reviewed and are negative.     Objective:    Physical Exam Constitutional:      General: He is not in acute distress.    Appearance: Normal appearance. He is normal weight. He is not ill-appearing, toxic-appearing or diaphoretic.  Cardiovascular:     Rate and Rhythm: Normal rate and regular rhythm.     Heart sounds: Normal heart sounds. No murmur heard. No friction rub. No gallop.   Pulmonary:     Effort: Pulmonary effort is normal. No respiratory distress.     Breath sounds: Normal breath sounds. No stridor. No wheezing, rhonchi or rales.  Chest:     Chest wall: No tenderness.  Neurological:     General: No focal deficit present.     Mental Status: He is alert and oriented to person, place, and time. Mental status is at baseline.  Psychiatric:        Mood and Affect: Mood normal.        Behavior: Behavior normal.        Thought Content: Thought content normal.        Judgment: Judgment normal.     BP  135/83   Pulse 77   Temp 97.7 F (36.5 C) (Temporal)   Resp 18   Ht '5\' 9"'  (1.753 m)   Wt 153 lb (69.4 kg)   SpO2 99%   BMI 22.59 kg/m  Wt Readings from Last 3 Encounters:  11/25/20 153 lb (69.4 kg)  04/09/20 157 lb 9.6 oz (71.5 kg)  12/21/17 136 lb 3.2 oz (61.8 kg)     There are no preventive care reminders to display for this patient.  There are no preventive care reminders to display for this patient.  No results found for: TSH Lab Results  Component Value Date   WBC 6.0 01/27/2013   HGB 15.9 01/27/2013   HCT 48.7 01/27/2013   MCV 83.7 01/27/2013   No results found for: NA, K, CHLORIDE, CO2, GLUCOSE, BUN, CREATININE, BILITOT, ALKPHOS, AST, ALT, PROT, ALBUMIN, CALCIUM, ANIONGAP, EGFR, GFR No results found for: CHOL No results found for: HDL No results found for:  LDLCALC No results found for: TRIG No results found for: CHOLHDL No results found for: HGBA1C    Assessment & Plan:   Problem List Items Addressed This Visit   None   Visit Diagnoses    Screen for STD (sexually transmitted disease)    -  Primary   Relevant Orders   HIV antibody (with reflex)   RPR   Urine cytology ancillary only   Screening for endocrine, metabolic and immunity disorder       Relevant Orders   CBC with Differential   TSH   Hemoglobin A1c   Comprehensive metabolic panel   Lipid screening       Relevant Orders   Lipid panel      No orders of the defined types were placed in this encounter.   Follow-up: No follow-ups on file.   PLAN  Labs collected. Will follow up with the patient as warranted.  Follow up as warranted  Return for CPE in 2022.  Patient encouraged to call clinic with any questions, comments, or concerns.  Maximiano Coss, NP

## 2020-11-25 NOTE — Telephone Encounter (Signed)
Aaron Garner has follow up questions from todays visit

## 2020-11-26 LAB — CBC WITH DIFFERENTIAL/PLATELET
Basophils Absolute: 0 10*3/uL (ref 0.0–0.2)
Basos: 0 %
EOS (ABSOLUTE): 0.1 10*3/uL (ref 0.0–0.4)
Eos: 1 %
Hematocrit: 42.3 % (ref 37.5–51.0)
Hemoglobin: 14.6 g/dL (ref 13.0–17.7)
Immature Grans (Abs): 0 10*3/uL (ref 0.0–0.1)
Immature Granulocytes: 0 %
Lymphocytes Absolute: 1.9 10*3/uL (ref 0.7–3.1)
Lymphs: 28 %
MCH: 27.9 pg (ref 26.6–33.0)
MCHC: 34.5 g/dL (ref 31.5–35.7)
MCV: 81 fL (ref 79–97)
Monocytes Absolute: 0.9 10*3/uL (ref 0.1–0.9)
Monocytes: 13 %
Neutrophils Absolute: 4 10*3/uL (ref 1.4–7.0)
Neutrophils: 58 %
Platelets: 216 10*3/uL (ref 150–450)
RBC: 5.24 x10E6/uL (ref 4.14–5.80)
RDW: 13.6 % (ref 11.6–15.4)
WBC: 6.8 10*3/uL (ref 3.4–10.8)

## 2020-11-26 LAB — HIV ANTIBODY (ROUTINE TESTING W REFLEX): HIV Screen 4th Generation wRfx: NONREACTIVE

## 2020-11-26 LAB — COMPREHENSIVE METABOLIC PANEL
ALT: 15 IU/L (ref 0–44)
AST: 22 IU/L (ref 0–40)
Albumin/Globulin Ratio: 1.8 (ref 1.2–2.2)
Albumin: 4.9 g/dL (ref 4.1–5.2)
Alkaline Phosphatase: 108 IU/L (ref 44–121)
BUN/Creatinine Ratio: 10 (ref 9–20)
BUN: 10 mg/dL (ref 6–20)
Bilirubin Total: 0.9 mg/dL (ref 0.0–1.2)
CO2: 24 mmol/L (ref 20–29)
Calcium: 9.5 mg/dL (ref 8.7–10.2)
Chloride: 102 mmol/L (ref 96–106)
Creatinine, Ser: 0.98 mg/dL (ref 0.76–1.27)
GFR calc Af Amer: 119 mL/min/{1.73_m2} (ref >59)
GFR calc non Af Amer: 103 mL/min/{1.73_m2} (ref >59)
Globulin, Total: 2.7 g/dL (ref 1.5–4.5)
Glucose: 77 mg/dL (ref 65–99)
Potassium: 4.3 mmol/L (ref 3.5–5.2)
Sodium: 142 mmol/L (ref 134–144)
Total Protein: 7.6 g/dL (ref 6.0–8.5)

## 2020-11-26 LAB — LIPID PANEL
Chol/HDL Ratio: 3.8 ratio (ref 0.0–5.0)
Cholesterol, Total: 191 mg/dL (ref 100–199)
HDL: 50 mg/dL (ref >39)
LDL Chol Calc (NIH): 119 mg/dL — ABNORMAL HIGH (ref 0–99)
Triglycerides: 125 mg/dL (ref 0–149)
VLDL Cholesterol Cal: 22 mg/dL (ref 5–40)

## 2020-11-26 LAB — HEMOGLOBIN A1C
Est. average glucose Bld gHb Est-mCnc: 100 mg/dL
Hgb A1c MFr Bld: 5.1 % (ref 4.8–5.6)

## 2020-11-26 LAB — URINE CYTOLOGY ANCILLARY ONLY
Chlamydia: NEGATIVE
Comment: NEGATIVE
Comment: NEGATIVE
Comment: NORMAL
Neisseria Gonorrhea: NEGATIVE
Trichomonas: NEGATIVE

## 2020-11-26 LAB — TSH: TSH: 1.38 u[IU]/mL (ref 0.450–4.500)

## 2020-11-26 LAB — RPR: RPR Ser Ql: NONREACTIVE

## 2021-01-13 ENCOUNTER — Encounter: Payer: 59 | Admitting: Registered Nurse

## 2021-11-06 ENCOUNTER — Emergency Department (HOSPITAL_COMMUNITY)
Admission: EM | Admit: 2021-11-06 | Discharge: 2021-11-07 | Disposition: A | Discharge status: Home / Self Care | Payer: BC Managed Care – PPO | Attending: Emergency Medicine | Admitting: Emergency Medicine

## 2021-11-06 ENCOUNTER — Encounter (HOSPITAL_COMMUNITY): Payer: Self-pay

## 2021-11-06 DIAGNOSIS — Y92410 Unspecified street and highway as the place of occurrence of the external cause: Secondary | ICD-10-CM | POA: Diagnosis not present

## 2021-11-06 DIAGNOSIS — S0992XA Unspecified injury of nose, initial encounter: Secondary | ICD-10-CM | POA: Diagnosis present

## 2021-11-06 DIAGNOSIS — S022XXA Fracture of nasal bones, initial encounter for closed fracture: Secondary | ICD-10-CM | POA: Diagnosis not present
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
## 2021-11-07 ENCOUNTER — Emergency Department (HOSPITAL_COMMUNITY): Payer: BC Managed Care – PPO
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
## 2021-11-07 NOTE — ED Notes (Signed)
E-signature pad unavailable at time of pt discharge. This RN discussed discharge materials with pt and answered all pt questions. Pt stated understanding of discharge material. ? ?

## 2022-08-08 ENCOUNTER — Encounter (HOSPITAL_BASED_OUTPATIENT_CLINIC_OR_DEPARTMENT_OTHER): Payer: Self-pay | Admitting: *Deleted

## 2022-08-08 ENCOUNTER — Emergency Department (HOSPITAL_BASED_OUTPATIENT_CLINIC_OR_DEPARTMENT_OTHER): Payer: BC Managed Care – PPO | Admitting: Radiology

## 2022-08-08 ENCOUNTER — Encounter (HOSPITAL_COMMUNITY): Payer: Self-pay

## 2022-08-08 ENCOUNTER — Emergency Department (HOSPITAL_BASED_OUTPATIENT_CLINIC_OR_DEPARTMENT_OTHER)
Admission: EM | Admit: 2022-08-08 | Discharge: 2022-08-08 | Disposition: A | Discharge status: Home / Self Care | Payer: BC Managed Care – PPO | Attending: Emergency Medicine | Admitting: Emergency Medicine

## 2022-08-08 ENCOUNTER — Ambulatory Visit (HOSPITAL_COMMUNITY)
Admission: EM | Admit: 2022-08-08 | Discharge: 2022-08-08 | Disposition: A | Discharge status: Home / Self Care | Payer: BC Managed Care – PPO

## 2022-08-08 ENCOUNTER — Emergency Department (HOSPITAL_BASED_OUTPATIENT_CLINIC_OR_DEPARTMENT_OTHER): Payer: BC Managed Care – PPO

## 2022-08-08 ENCOUNTER — Other Ambulatory Visit: Payer: Self-pay

## 2022-08-08 DIAGNOSIS — S4992XA Unspecified injury of left shoulder and upper arm, initial encounter: Secondary | ICD-10-CM | POA: Diagnosis not present

## 2022-08-08 DIAGNOSIS — R412 Retrograde amnesia: Secondary | ICD-10-CM

## 2022-08-08 DIAGNOSIS — S0101XA Laceration without foreign body of scalp, initial encounter: Secondary | ICD-10-CM

## 2022-08-08 DIAGNOSIS — S0990XA Unspecified injury of head, initial encounter: Secondary | ICD-10-CM

## 2022-08-08 DIAGNOSIS — Z23 Encounter for immunization: Secondary | ICD-10-CM | POA: Insufficient documentation

## 2022-08-08 MED ORDER — KETOROLAC TROMETHAMINE 10 MG PO TABS: 10.0000 mg | ORAL_TABLET | Freq: Four times a day (QID) | ORAL | 0 refills | Status: AC | PRN

## 2022-08-08 MED ORDER — TETANUS-DIPHTH-ACELL PERTUSSIS 5-2.5-18.5 LF-MCG/0.5 IM SUSY
0.5000 mL | PREFILLED_SYRINGE | Freq: Once | INTRAMUSCULAR | Status: AC
  Administered 2022-08-08: 0.5 mL via INTRAMUSCULAR
  Filled 2022-08-08: qty 0.5

## 2022-08-08 MED ORDER — OXYCODONE-ACETAMINOPHEN 5-325 MG PO TABS
1.0000 | ORAL_TABLET | ORAL | Status: AC | PRN
  Administered 2022-08-08 (×2): 1 via ORAL
  Filled 2022-08-08 (×2): qty 1

## 2022-08-08 MED ORDER — KETOROLAC TROMETHAMINE 10 MG PO TABS: 10.0000 mg | ORAL_TABLET | Freq: Four times a day (QID) | ORAL | 0 refills | Status: DC | PRN
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
## 2022-08-08 NOTE — Discharge Instructions (Addendum)
Please follow-up with your primary care doctor in 3 days.  If you develop severe pain in the left arm, any kind of numbness, tingling, loss of color/sensation in your arm please return to the ER.  This may be a life-threatening emergency.  Use ice for your arm 15 minutes on 15 minutes off.  And use the medication prescribed to you for pain control.  Please follow-up with your primary care doctor in 10 days for staple removal.  You can take a shower tonight with it.

## 2022-08-08 NOTE — ED Notes (Addendum)
Late entry -- Pt ambulatory to room from waiting room escorted by visitor and ED tech - gait steady; no obvious distress noted.
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
## 2022-08-08 NOTE — Discharge Instructions (Addendum)
Please go to the emergency department right away to be evaluated.

## 2022-08-08 NOTE — ED Notes (Signed)
ED Provider at bedside. 
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
## 2022-08-08 NOTE — ED Notes (Signed)
Pt agreeable with d/c plan as discussed by provider- this nurse has verbally reinforced d/c instructions and provided pt with written copy - pt acknowledges verbal understanding and denies any addl questions concerns needs - pt ambulatory independently at d/c with steady gait escorted by spouse- no acute distress

## 2022-08-18 ENCOUNTER — Encounter (HOSPITAL_COMMUNITY): Payer: Self-pay

## 2022-08-18 ENCOUNTER — Ambulatory Visit (HOSPITAL_COMMUNITY)
Admission: EM | Admit: 2022-08-18 | Discharge: 2022-08-18 | Disposition: A | Discharge status: Home / Self Care | Payer: BC Managed Care – PPO | Attending: Internal Medicine | Admitting: Internal Medicine

## 2022-08-18 DIAGNOSIS — Z4802 Encounter for removal of sutures: Secondary | ICD-10-CM

## 2022-08-18 DIAGNOSIS — S0101XD Laceration without foreign body of scalp, subsequent encounter: Secondary | ICD-10-CM
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
